# Patient Record
Sex: Male | Born: 1980 | Hispanic: Yes | Marital: Married | Smoking: Current every day smoker
Health system: Southern US, Community
[De-identification: ages and names within clinical notes are randomized; demographics above are authoritative.]

---

## 2019-10-15 ENCOUNTER — Other Ambulatory Visit: Payer: Self-pay

## 2019-10-15 ENCOUNTER — Encounter: Payer: Self-pay | Admitting: Emergency Medicine

## 2019-10-15 ENCOUNTER — Emergency Department: Payer: No Typology Code available for payment source

## 2019-10-15 DIAGNOSIS — M79641 Pain in right hand: Secondary | ICD-10-CM | POA: Insufficient documentation

## 2019-10-15 DIAGNOSIS — Y9389 Activity, other specified: Secondary | ICD-10-CM | POA: Diagnosis not present

## 2019-10-15 DIAGNOSIS — S161XXA Strain of muscle, fascia and tendon at neck level, initial encounter: Secondary | ICD-10-CM | POA: Insufficient documentation

## 2019-10-15 DIAGNOSIS — S199XXA Unspecified injury of neck, initial encounter: Secondary | ICD-10-CM | POA: Diagnosis present

## 2019-10-15 DIAGNOSIS — F17210 Nicotine dependence, cigarettes, uncomplicated: Secondary | ICD-10-CM | POA: Insufficient documentation

## 2019-10-15 DIAGNOSIS — Y92410 Unspecified street and highway as the place of occurrence of the external cause: Secondary | ICD-10-CM | POA: Insufficient documentation

## 2019-10-15 LAB — TROPONIN I (HIGH SENSITIVITY): Troponin I (High Sensitivity): 16 ng/L (ref <18)
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
## 2019-10-16 ENCOUNTER — Emergency Department
Admission: EM | Admit: 2019-10-16 | Discharge: 2019-10-16 | Disposition: A | Discharge status: Home / Self Care | Payer: No Typology Code available for payment source | Attending: Emergency Medicine | Admitting: Emergency Medicine

## 2019-10-16 DIAGNOSIS — M79641 Pain in right hand: Secondary | ICD-10-CM

## 2019-10-16 DIAGNOSIS — S161XXA Strain of muscle, fascia and tendon at neck level, initial encounter: Secondary | ICD-10-CM

## 2019-10-16 LAB — CBC WITH DIFFERENTIAL/PLATELET
Abs Immature Granulocytes: 0.04 10*3/uL (ref 0.00–0.07)
Basophils Absolute: 0.1 10*3/uL (ref 0.0–0.1)
Basophils Relative: 1 %
Eosinophils Absolute: 0.4 10*3/uL (ref 0.0–0.5)
Eosinophils Relative: 3 %
HCT: 47.4 % (ref 39.0–52.0)
Hemoglobin: 16.9 g/dL (ref 13.0–17.0)
Immature Granulocytes: 0 %
Lymphocytes Relative: 39 %
Lymphs Abs: 4.6 10*3/uL — ABNORMAL HIGH (ref 0.7–4.0)
MCH: 31.6 pg (ref 26.0–34.0)
MCHC: 35.7 g/dL (ref 30.0–36.0)
MCV: 88.8 fL (ref 80.0–100.0)
Monocytes Absolute: 0.7 10*3/uL (ref 0.1–1.0)
Monocytes Relative: 6 %
Neutro Abs: 6 10*3/uL (ref 1.7–7.7)
Neutrophils Relative %: 51 %
Platelets: 252 10*3/uL (ref 150–400)
RBC: 5.34 MIL/uL (ref 4.22–5.81)
RDW: 13 % (ref 11.5–15.5)
WBC: 11.7 10*3/uL — ABNORMAL HIGH (ref 4.0–10.5)
nRBC: 0 % (ref 0.0–0.2)

## 2019-10-16 LAB — TROPONIN I (HIGH SENSITIVITY): Troponin I (High Sensitivity): 17 ng/L (ref <18)

## 2019-10-16 LAB — BASIC METABOLIC PANEL
Anion gap: 9 (ref 5–15)
BUN: 15 mg/dL (ref 6–20)
CO2: 23 mmol/L (ref 22–32)
Calcium: 9.9 mg/dL (ref 8.9–10.3)
Chloride: 103 mmol/L (ref 98–111)
Creatinine, Ser: 0.92 mg/dL (ref 0.61–1.24)
GFR calc Af Amer: >60 mL/min (ref >60)
GFR calc non Af Amer: >60 mL/min (ref >60)
Glucose, Bld: 123 mg/dL — ABNORMAL HIGH (ref 70–99)
Potassium: 3.8 mmol/L (ref 3.5–5.1)
Sodium: 135 mmol/L (ref 135–145)
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
## 2019-10-20 ENCOUNTER — Encounter: Payer: Self-pay | Admitting: Emergency Medicine

## 2019-10-20 ENCOUNTER — Ambulatory Visit (INDEPENDENT_AMBULATORY_CARE_PROVIDER_SITE_OTHER): Payer: Self-pay

## 2019-10-20 ENCOUNTER — Other Ambulatory Visit: Payer: Self-pay

## 2019-10-20 ENCOUNTER — Ambulatory Visit
Admission: EM | Admit: 2019-10-20 | Discharge: 2019-10-20 | Disposition: A | Discharge status: Home / Self Care | Payer: Self-pay | Attending: Emergency Medicine | Admitting: Emergency Medicine

## 2019-10-20 DIAGNOSIS — S61432A Puncture wound without foreign body of left hand, initial encounter: Secondary | ICD-10-CM

## 2019-10-20 DIAGNOSIS — Z23 Encounter for immunization: Secondary | ICD-10-CM

## 2019-10-20 MED ORDER — CEPHALEXIN 500 MG PO CAPS: 500.0000 mg | ORAL_CAPSULE | Freq: Four times a day (QID) | ORAL | 0 refills | Status: AC

## 2019-10-20 MED ORDER — TETANUS-DIPHTH-ACELL PERTUSSIS 5-2.5-18.5 LF-MCG/0.5 IM SUSP
0.5000 mL | Freq: Once | INTRAMUSCULAR | Status: AC
  Administered 2019-10-20: 0.5 mL via INTRAMUSCULAR

## 2019-10-20 NOTE — Discharge Instructions (Addendum)
Keep the wound clean and dry. Take the Keflex 4 times a day with food for 10 days to prevent infection. Use OTC Ibuprofen as needed for pain.  Return for new or worsening symptoms.
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
## 2019-10-20 NOTE — ED Triage Notes (Signed)
Patient states a nail went through his left palm yesterday while he was working on his house.

## 2020-01-26 ENCOUNTER — Emergency Department
Admission: EM | Admit: 2020-01-26 | Discharge: 2020-01-26 | Disposition: A | Discharge status: Home / Self Care | Payer: HRSA Program | Attending: Emergency Medicine | Admitting: Emergency Medicine

## 2020-01-26 ENCOUNTER — Other Ambulatory Visit: Payer: Self-pay

## 2020-01-26 DIAGNOSIS — U071 COVID-19: Secondary | ICD-10-CM | POA: Insufficient documentation

## 2020-01-26 DIAGNOSIS — M791 Myalgia, unspecified site: Secondary | ICD-10-CM | POA: Insufficient documentation

## 2020-01-26 DIAGNOSIS — F17200 Nicotine dependence, unspecified, uncomplicated: Secondary | ICD-10-CM | POA: Insufficient documentation

## 2020-01-26 DIAGNOSIS — Z20822 Contact with and (suspected) exposure to covid-19: Secondary | ICD-10-CM

## 2020-01-26 NOTE — ED Provider Notes (Signed)
[facility] Emergency Department Provider Note   ____________________________________________    I have reviewed the triage vital signs and the nursing notes.   HISTORY  Chief Complaint covid test     HPI Edward Roberts is a 40 y.o. male who presents with complaints of myalgias and close exposure to someone with COVID-19, requesting Covid test.  No shortness of breath, mild sore throat, no cough   No past medical history on file.  There are no problems to display for this patient.   No past surgical history on file.  Prior to Admission medications   Medication Sig Start Date End Date Taking? Authorizing Provider  cephALEXin (KEFLEX) 500 MG capsule Take 1 capsule (500 mg total) by mouth 4 (four) times daily. 10/20/19   Becky Augusta, NP     Allergies Patient has no known allergies.  Family History  Problem Relation Age of Onset  . Healthy Mother     Social History Social History   Tobacco Use  . Smoking status: Current Every Day Smoker  . Smokeless tobacco: Never Used  Substance Use Topics  . Alcohol use: Not Currently  . Drug use: Never    Review of Systems  Constitutional: As above  ENT: As above    Musculoskeletal: Myalgias Skin: Negative for rash. Neurological: Negative for headaches     ____________________________________________   PHYSICAL EXAM:  VITAL SIGNS: ED Triage Vitals  Enc Vitals Group     BP 01/26/20 1153 119/84     Pulse Rate 01/26/20 1153 75     Resp 01/26/20 1153 17     Temp 01/26/20 1153 97.9 F (36.6 C)     Temp Source 01/26/20 1153 Oral     SpO2 01/26/20 1153 98 %     Weight 01/26/20 1154 89.8 kg (198 lb)     Height 01/26/20 1154 1.727 m (5\' 8" )     Head Circumference --      Peak Flow --      Pain Score 01/26/20 1156 7     Pain Loc --      Pain Edu? --      Excl. in GC? --     Constitutional: Alert and oriented. No acute distress Eyes: Conjunctivae are normal.  Head:  Atraumatic. Nose: No congestion/rhinnorhea. Mouth/Throat: Mucous membranes are moist.   Cardiovascular: Normal rate, regular rhythm.  Respiratory: Normal respiratory effort.  No retractions. Genitourinary: deferred Musculoskeletal: No lower extremity tenderness nor edema.   Neurologic:  Normal speech and language. No gross focal neurologic deficits are appreciated.   Skin:  Skin is warm, dry and intact. No rash noted.   ____________________________________________   LABS (all labs ordered are listed, but only abnormal results are displayed)  Labs Reviewed  SARS CORONAVIRUS 2 (TAT 6-24 HRS)   ____________________________________________  EKG   ____________________________________________  RADIOLOGY  ____________________________________________   PROCEDURES  Procedure(s) performed: No  Procedures   Critical Care performed: No ____________________________________________   INITIAL IMPRESSION / ASSESSMENT AND PLAN / ED COURSE  Pertinent labs & imaging results that were available during my care of the patient were reviewed by me and considered in my medical decision making (see chart for details).  Patient overall well-appearing in no acute distress, is having mild COVID-19 symptoms, will send Covid swab given close exposure,   ____________________________________________   FINAL CLINICAL IMPRESSION(S) / ED DIAGNOSES  Final diagnoses:  Suspected COVID-19 virus infection      NEW MEDICATIONS STARTED DURING THIS VISIT:  Discharge Medication List as of 01/26/2020 12:12 PM       Note:  This document was prepared using Dragon voice recognition software and may include unintentional dictation errors.   Jene Every, MD 01/26/20 1304
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
## 2020-01-26 NOTE — ED Notes (Signed)
Pt assessed by provider prior to d/c.  

## 2020-01-27 LAB — SARS CORONAVIRUS 2 (TAT 6-24 HRS): SARS Coronavirus 2: POSITIVE — AB
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
